# Patient Record
Sex: Female | Born: 1965 | Race: Black or African American | Hispanic: No | Marital: Single | Smoking: Never smoker
Health system: Southern US, Community
[De-identification: ages and names within clinical notes are randomized; demographics above are authoritative.]

---

## 2015-03-30 ENCOUNTER — Emergency Department (HOSPITAL_COMMUNITY): Payer: Worker's Compensation

## 2015-03-30 ENCOUNTER — Emergency Department (HOSPITAL_COMMUNITY)
Admission: EM | Admit: 2015-03-30 | Discharge: 2015-03-30 | Disposition: A | Discharge status: Home / Self Care | Payer: Worker's Compensation | Attending: Emergency Medicine | Admitting: Emergency Medicine

## 2015-03-30 ENCOUNTER — Encounter (HOSPITAL_COMMUNITY): Payer: Self-pay | Admitting: Emergency Medicine

## 2015-03-30 DIAGNOSIS — S93504A Unspecified sprain of right lesser toe(s), initial encounter: Secondary | ICD-10-CM | POA: Insufficient documentation

## 2015-03-30 DIAGNOSIS — Y929 Unspecified place or not applicable: Secondary | ICD-10-CM | POA: Insufficient documentation

## 2015-03-30 DIAGNOSIS — Y990 Civilian activity done for income or pay: Secondary | ICD-10-CM | POA: Insufficient documentation

## 2015-03-30 DIAGNOSIS — S93501A Unspecified sprain of right great toe, initial encounter: Secondary | ICD-10-CM | POA: Insufficient documentation

## 2015-03-30 DIAGNOSIS — Y9389 Activity, other specified: Secondary | ICD-10-CM | POA: Insufficient documentation

## 2015-03-30 DIAGNOSIS — S99921A Unspecified injury of right foot, initial encounter: Secondary | ICD-10-CM | POA: Diagnosis present

## 2015-03-30 DIAGNOSIS — S93509A Unspecified sprain of unspecified toe(s), initial encounter: Secondary | ICD-10-CM

## 2015-03-30 DIAGNOSIS — X58XXXA Exposure to other specified factors, initial encounter: Secondary | ICD-10-CM | POA: Diagnosis not present

## 2015-03-30 DIAGNOSIS — S93601A Unspecified sprain of right foot, initial encounter: Secondary | ICD-10-CM | POA: Insufficient documentation

## 2015-03-30 NOTE — ED Provider Notes (Signed)
CSN: [id number]     Arrival date & time 03/30/15  0945 History  This chart was scribed for non-physician practitioner, Teressa Lower, NP, working with Mancel Bale, MD, by Ronney Lion, ED Scribe. This patient was seen in room TR10C/TR10C and the patient's care was started at 9:48 AM.    Chief Complaint  Patient presents with  . Foot Injury   The history is provided by the patient. No language interpreter was used.    HPI Comments: Laura Sheppard is a 49 y.o. female who presents to the Emergency Department complaining of constant, moderate right foot pain, redness, and swelling on her dorsal foot that began after an injury at work yesterday. She states she was moving rugs when her foot got caught underneath a rug and she twisted her foot. Patient took naproxen yesterday with some relief. She denies any prior injuries with this foot. She also denies any chronic medical problems. She denies any lacerations or abrasions.   History reviewed. No pertinent past medical history. History reviewed. No pertinent past surgical history. History reviewed. No pertinent family history. History  Substance Use Topics  . Smoking status: Not on file  . Smokeless tobacco: Not on file  . Alcohol Use: Not on file   OB History    No data available     Review of Systems  Musculoskeletal: Positive for myalgias (right foot pain).  Skin: Positive for color change (redness).  All other systems reviewed and are negative.  Allergies  Review of patient's allergies indicates no known allergies.  Home Medications   Prior to Admission medications   Not on File   BP 123/72 mmHg  Pulse 52  Temp(Src) 97.9 F (36.6 C) (Oral)  Resp 16  SpO2 100% Physical Exam  Constitutional: She is oriented to person, place, and time. She appears well-developed and well-nourished. No distress.  HENT:  Head: Normocephalic and atraumatic.  Eyes: Conjunctivae and EOM are normal.  Neck: Neck supple. No tracheal deviation  present.  Cardiovascular: Normal rate.   Pulmonary/Chest: Effort normal. No respiratory distress.  Musculoskeletal: Normal range of motion.  Swelling and tenderness to the top of the right foot below the first and second toe  Neurological: She is alert and oriented to person, place, and time.  Skin: Skin is warm and dry.  Psychiatric: She has a normal mood and affect. Her behavior is normal.  Nursing note and vitals reviewed.   ED Course  Procedures (including critical care time)  DIAGNOSTIC STUDIES: Oxygen Saturation is 100% on RA, normal by my interpretation.    COORDINATION OF CARE: 9:52 AM - Discussed treatment plan with pt at bedside which includes right foot XR, and pt agreed to plan.  10:58 AM - Suspect ankle sprain. Pt made aware of negative XR.   Imaging Review Dg Foot Complete Right  03/30/2015   CLINICAL DATA:  Foot injury, tripped over a right today, twisted right foot, pain to touch and numbness  EXAM: RIGHT FOOT COMPLETE - 3+ VIEW  COMPARISON:  None.  FINDINGS: Three views of the right foot submitted. No acute fracture or subluxation. Mild soft tissue swelling dorsal metatarsal region. Mild degenerative changes first metatarsal phalangeal joint. There is medial and dorsal spurring of navicular. Tiny plantar spur of calcaneus.  IMPRESSION: No acute fracture or subluxation. Degenerative changes as described above. Mild soft tissue swelling dorsal metatarsal region.   Electronically Signed   By: Natasha Mead M.D.   On: 03/30/2015 10:48     MDM  Final diagnoses:  Toe sprain, initial encounter  Foot sprain, right, initial encounter   No acute bony abnormality noted. Pt placed in ace wrap and post op shoe for comfort  I personally performed the services described in this documentation, which was scribed in my presence. The recorded information has been reviewed and is accurate.    Teressa Lower, NP 03/30/15 1120  Mancel Bale, MD 03/31/15 [phone]

## 2015-03-30 NOTE — Discharge Instructions (Signed)
Foot Sprain The muscles and cord like structures which attach muscle to bone (tendons) that surround the feet are made up of units. A foot sprain can occur at the weakest spot in any of these units. This condition is most often caused by injury to or overuse of the foot, as from playing contact sports, or aggravating a previous injury, or from poor conditioning, or obesity. SYMPTOMS  Pain with movement of the foot.  Tenderness and swelling at the injury site.  Loss of strength is present in moderate or severe sprains. THE THREE GRADES OR SEVERITY OF FOOT SPRAIN ARE:  Mild (Grade I): Slightly pulled muscle without tearing of muscle or tendon fibers or loss of strength.  Moderate (Grade II): Tearing of fibers in a muscle, tendon, or at the attachment to bone, with small decrease in strength.  Severe (Grade III): Rupture of the muscle-tendon-bone attachment, with separation of fibers. Severe sprain requires surgical repair. Often repeating (chronic) sprains are caused by overuse. Sudden (acute) sprains are caused by direct injury or over-use. DIAGNOSIS  Diagnosis of this condition is usually by your own observation. If problems continue, a caregiver may be required for further evaluation and treatment. X-rays may be required to make sure there are not breaks in the bones (fractures) present. Continued problems may require physical therapy for treatment. PREVENTION  Use strength and conditioning exercises appropriate for your sport.  Warm up properly prior to working out.  Use athletic shoes that are made for the sport you are participating in.  Allow adequate time for healing. Early return to activities makes repeat injury more likely, and can lead to an unstable arthritic foot that can result in prolonged disability. Mild sprains generally heal in 3 to 10 days, with moderate and severe sprains taking 2 to 10 weeks. Your caregiver can help you determine the proper time required for  healing. HOME CARE INSTRUCTIONS   Apply ice to the injury for 15-20 minutes, 03-04 times per day. Put the ice in a plastic bag and place a towel between the bag of ice and your skin.  An elastic wrap (like an Ace bandage) may be used to keep swelling down.  Keep foot above the level of the heart, or at least raised on a footstool, when swelling and pain are present.  Try to avoid use other than gentle range of motion while the foot is painful. Do not resume use until instructed by your caregiver. Then begin use gradually, not increasing use to the point of pain. If pain does develop, decrease use and continue the above measures, gradually increasing activities that do not cause discomfort, until you gradually achieve normal use.  Use crutches if and as instructed, and for the length of time instructed.  Keep injured foot and ankle wrapped between treatments.  Massage foot and ankle for comfort and to keep swelling down. Massage from the toes up towards the knee.  Only take over-the-counter or prescription medicines for pain, discomfort, or fever as directed by your caregiver. SEEK IMMEDIATE MEDICAL CARE IF:   Your pain and swelling increase, or pain is not controlled with medications.  You have loss of feeling in your foot or your foot turns cold or blue.  You develop new, unexplained symptoms, or an increase of the symptoms that brought you to your caregiver. MAKE SURE YOU:   Understand these instructions.  Will watch your condition.  Will get help right away if you are not doing well or get worse. Document Released:   03/24/2002 Document Revised: 12/25/2011 Document Reviewed: 05/21/2008 [employer] Patient Information 2015 [employer]. This information is not intended to replace advice given to you by your health care provider. Make sure you discuss any questions you have with your health care provider.  

## 2015-03-30 NOTE — ED Notes (Signed)
Pt reports falling while at work yesterday. States took naproxen last night for pain. Right foot with reddness and swelling. Tender to palpation. Distal circulation intact.

## 2016-01-14 IMAGING — CR DG FOOT COMPLETE 3+V*R*
3 series · 3 of 3 positions shown · non-contrast
Comparison: None.

CLINICAL DATA: Foot injury, tripped over a right today, twisted
right foot, pain to touch and numbness

EXAM:
RIGHT FOOT COMPLETE - 3+ VIEW

[x foot ap right]
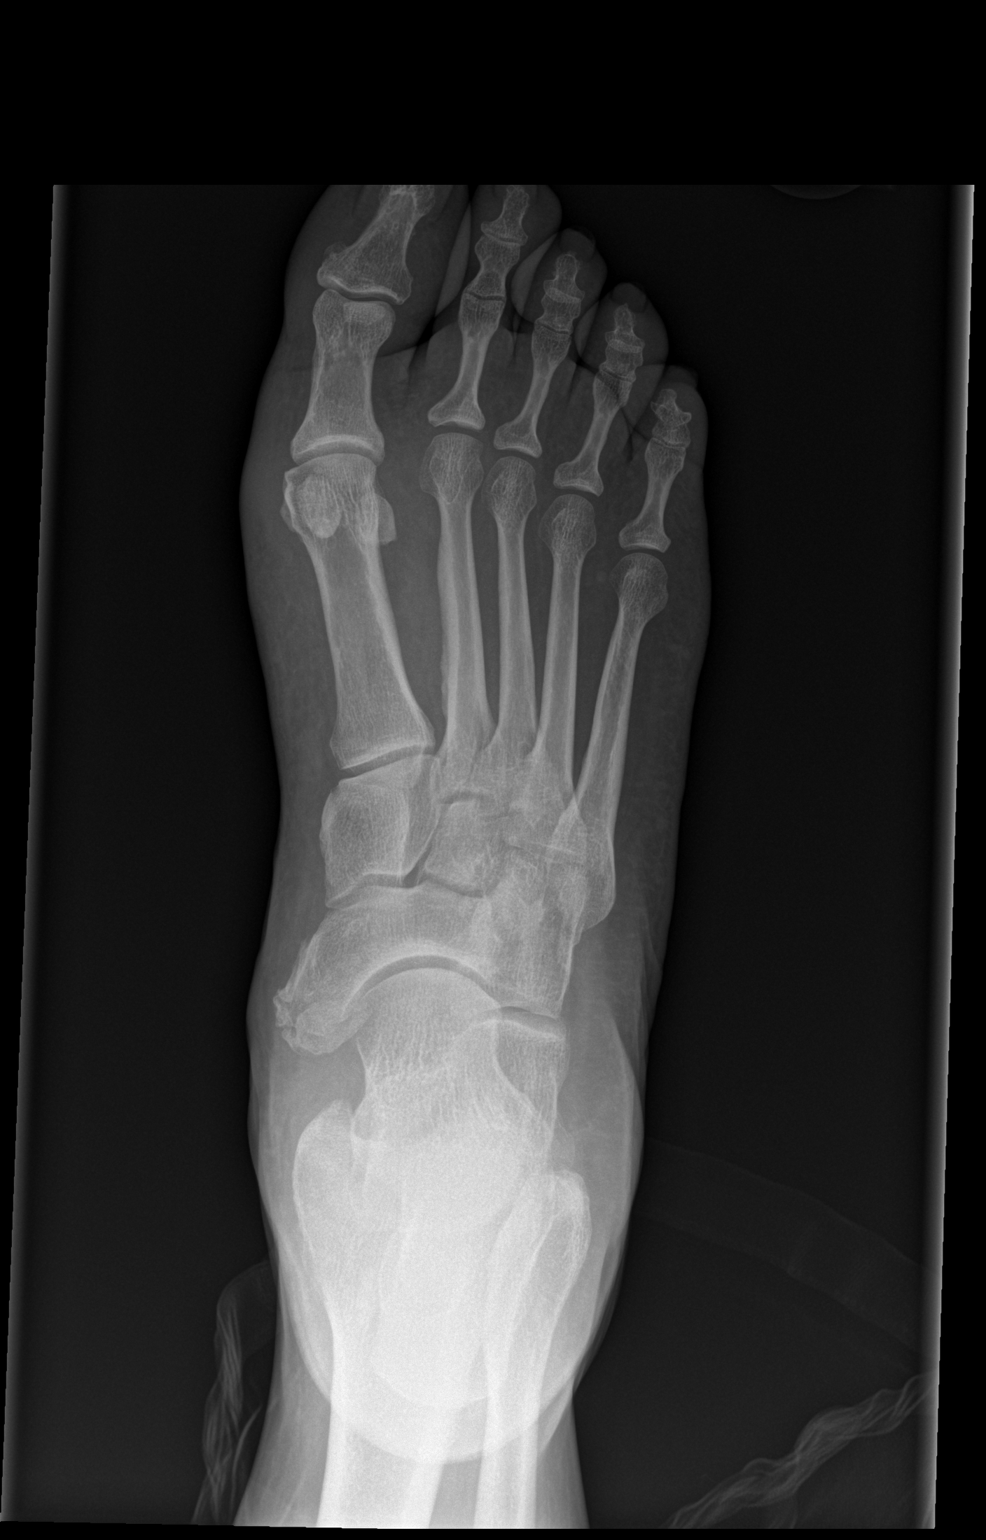

[x foot obl right]
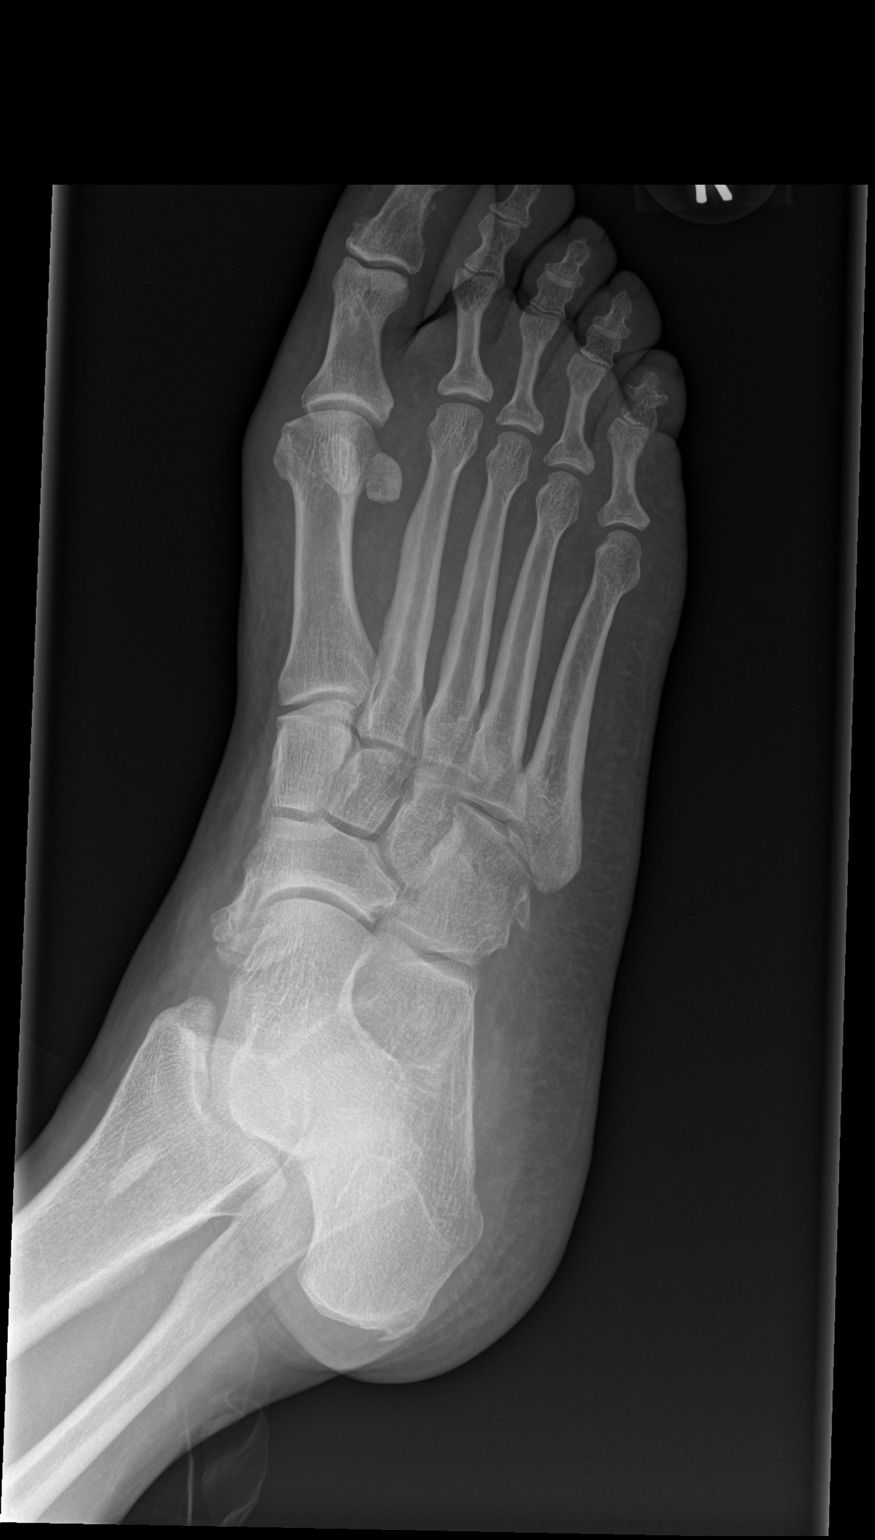

[x foot lat right]
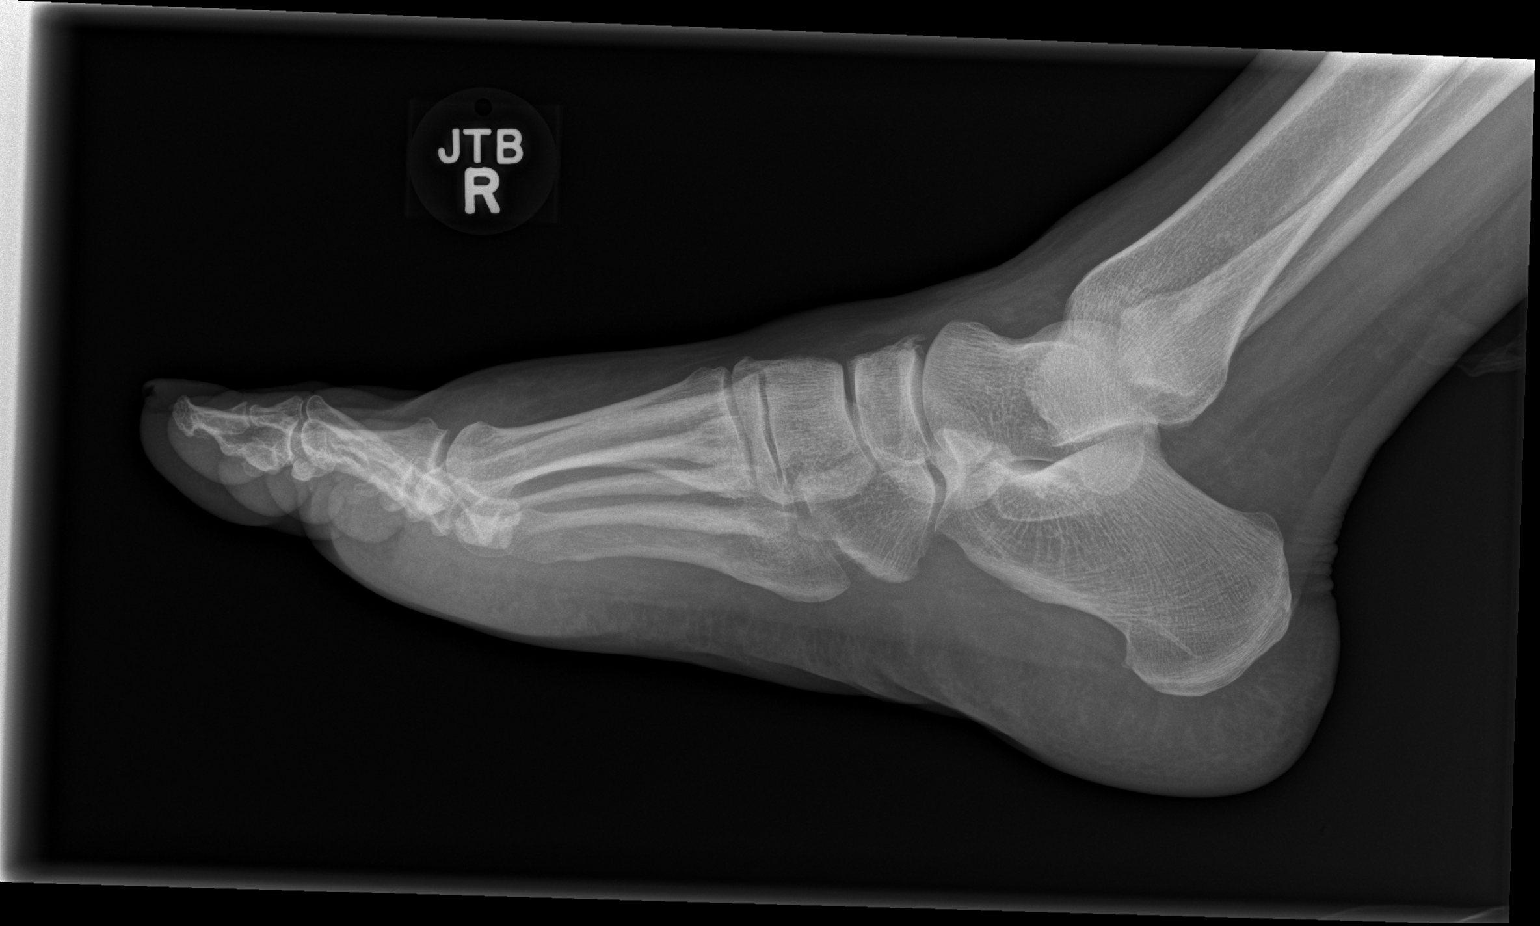

[3 of 3 positions shown; findings below may reference images not displayed]

FINDINGS: Three views of the right foot submitted. No acute fracture or
subluxation. Mild soft tissue swelling dorsal metatarsal region.
Mild degenerative changes first metatarsal phalangeal joint. There
is medial and dorsal spurring of navicular. Tiny plantar spur of
calcaneus.
IMPRESSION: No acute fracture or subluxation. Degenerative changes as described
above. Mild soft tissue swelling dorsal metatarsal region.
